# Patient Record
Sex: Female | Born: 2000
Health system: Southern US, Community
[De-identification: ages and names within clinical notes are randomized; demographics above are authoritative.]

## PROBLEM LIST (undated history)

## (undated) DIAGNOSIS — N83209 Unspecified ovarian cyst, unspecified side: Secondary | ICD-10-CM

## (undated) DIAGNOSIS — S63599A Other specified sprain of unspecified wrist, initial encounter: Secondary | ICD-10-CM

## (undated) DIAGNOSIS — Z87898 Personal history of other specified conditions: Secondary | ICD-10-CM

## (undated) HISTORY — PX: CLOSED REDUCTION FOREARM FRACTURE: SHX960

---
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
## 2019-01-19 ENCOUNTER — Encounter: Payer: Self-pay | Admitting: Emergency Medicine

## 2019-01-19 ENCOUNTER — Emergency Department
Admission: EM | Admit: 2019-01-19 | Discharge: 2019-01-19 | Disposition: A | Discharge status: Home / Self Care | Payer: BC Managed Care – PPO | Attending: Emergency Medicine | Admitting: Emergency Medicine

## 2019-01-19 ENCOUNTER — Other Ambulatory Visit: Payer: Self-pay

## 2019-01-19 ENCOUNTER — Emergency Department: Payer: BC Managed Care – PPO

## 2019-01-19 DIAGNOSIS — R519 Headache, unspecified: Secondary | ICD-10-CM | POA: Diagnosis not present

## 2019-01-19 DIAGNOSIS — Z79899 Other long term (current) drug therapy: Secondary | ICD-10-CM | POA: Insufficient documentation

## 2019-01-19 DIAGNOSIS — G43519 Persistent migraine aura without cerebral infarction, intractable, without status migrainosus: Secondary | ICD-10-CM | POA: Diagnosis not present

## 2019-01-19 LAB — POCT PREGNANCY, URINE: Preg Test, Ur: NEGATIVE

## 2019-01-19 MED ORDER — DIPHENHYDRAMINE HCL 50 MG/ML IJ SOLN
12.5000 mg | Freq: Once | INTRAMUSCULAR | Status: AC
  Administered 2019-01-19: 12.5 mg via INTRAVENOUS
  Filled 2019-01-19: qty 1

## 2019-01-19 MED ORDER — SODIUM CHLORIDE 0.9 % IV BOLUS
1000.0000 mL | Freq: Once | INTRAVENOUS | Status: AC
  Administered 2019-01-19: 1000 mL via INTRAVENOUS

## 2019-01-19 MED ORDER — ACETAMINOPHEN 500 MG PO TABS
1000.0000 mg | ORAL_TABLET | Freq: Once | ORAL | Status: AC
  Administered 2019-01-19: 1000 mg via ORAL
  Filled 2019-01-19: qty 2

## 2019-01-19 MED ORDER — METOCLOPRAMIDE HCL 5 MG/ML IJ SOLN
10.0000 mg | Freq: Once | INTRAMUSCULAR | Status: AC
  Administered 2019-01-19: 18:00:00 10 mg via INTRAVENOUS
  Filled 2019-01-19: qty 2

## 2019-01-19 NOTE — ED Provider Notes (Signed)
[facility] Emergency Department Provider Note  ____________________________________________   First MD Initiated Contact with Patient 01/19/19 1644     (approximate)  I have reviewed the triage vital signs and the nursing notes.   HISTORY  Chief Complaint Headache    HPI Paula Aguirre is a 19 y.o. female with history of seizure x1 no cause determined who comes in with pain.  Patient's headache started at 11:30 AM.  Patient states that around 1130 she initially had some vision changes in her right eye that lasted a few minutes and then afterwards she started develop a headache around her eye and into her forehead.  The pain was moderate initially she attempted to fall sleep  though the pain was getting stronger and was then severe and woke her up.  The pain was nonradiating, nothing made it better, she reports sensitivity to light.  She denies a history of migraines but states that her sister had migraines diagnosed in her 78s.  Currently her vision is back to normal.          Past Medical History:  Diagnosis Date  . History of seizure 12/2010   x 1 - no cause determined  . Ovarian cyst   . TFCC (triangular fibrocartilage complex) tear 01/2014   left    Patient Active Problem List   Diagnosis Date Noted  . Well adolescent visit 04/20/2017    Past Surgical History:  Procedure Laterality Date  . CLOSED REDUCTION FOREARM FRACTURE Left   . WRIST ARTHROSCOPY WITH DEBRIDEMENT Left 01/30/2014   Procedure: ARTHROSCOPY LEFT WRIST WITH DEBRIDEMENT;  Surgeon: Cindee Salt, MD;  Location: [facility];  Service: Orthopedics;  Laterality: Left;    Prior to Admission medications   Medication Sig Start Date End Date Taking? Authorizing Provider  cetirizine (ZYRTEC) 10 MG tablet Take by mouth.    [provider]  fluticasone (FLONASE) 50 MCG/ACT nasal spray Place 1 spray into both nostrils daily.    [provider]  ibuprofen  (ADVIL,MOTRIN) 200 MG tablet Take 600 mg by mouth every 6 (six) hours as needed for moderate pain.     [provider]  naproxen (NAPROSYN) 500 MG tablet Take 1 tablet (500 mg total) by mouth 2 (two) times daily with a meal. 10/21/18   Triplett, Cari B, FNP  norethindrone-ethinyl estradiol (JUNEL 1/20) 1-20 MG-MCG tablet Take 1 tablet by mouth daily. 01/12/18   Dianne Dun, MD    Allergies Molds & smuts, Cat hair extract, Gramineae pollens, and Pollen extract  Family History  Problem Relation Age of Onset  . Diabetes Paternal Grandfather   . Heart disease Father        MI    Social History Social History   Tobacco Use  . Smoking status: Never Smoker  . Smokeless tobacco: Never Used  Substance Use Topics  . Alcohol use: No  . Drug use: No      Review of Systems Constitutional: No fever/chills Eyes: Positive vision change ENT: No sore throat. Cardiovascular: Denies chest pain. Respiratory: Denies shortness of breath. Gastrointestinal: No abdominal pain.  No nausea, no vomiting.  No diarrhea.  No constipation. Genitourinary: Negative for dysuria. Musculoskeletal: Negative for back pain. Skin: Negative for rash. Neurological: Positive headache, focal weakness or numbness. All other ROS negative ____________________________________________   PHYSICAL EXAM:  VITAL SIGNS: ED Triage Vitals  Enc Vitals Group     BP 01/19/19 1530 125/81     Pulse Rate 01/19/19  1530 73     Resp 01/19/19 1530 18     Temp 01/19/19 1530 98.3 F (36.8 C)     Temp Source 01/19/19 1530 Oral     SpO2 01/19/19 1530 99 %     Weight 01/19/19 1530 120 lb (54.4 kg)     Height 01/19/19 1530 5\' 4"  (1.626 m)     Head Circumference --      Peak Flow --      Pain Score 01/19/19 1537 8     Pain Loc --      Pain Edu? --      Excl. in [place]? --     Constitutional: Alert and oriented. Well appearing and in no acute distress. Eyes: Conjunctivae are normal. EOMI. Head: Atraumatic. Nose: No  congestion/rhinnorhea. Mouth/Throat: Mucous membranes are moist.   Neck: No stridor. Trachea Midline. FROM Cardiovascular: Normal rate, regular rhythm. Grossly normal heart sounds.  Good peripheral circulation. Respiratory: Normal respiratory effort.  No retractions. Lungs CTAB. Gastrointestinal: Soft and nontender. No distention. No abdominal bruits.  Musculoskeletal: No lower extremity tenderness nor edema.  No joint effusions. Neurologic:  Normal speech and language. No gross focal neurologic deficits are appreciated.  Cranial nerves II through XII appear intact.  Equal strength in her arms and legs Skin:  Skin is warm, dry and intact. No rash noted. Psychiatric: Mood and affect are normal. Speech and behavior are normal. GU: Deferred   ____ RADIOLOGY   Official radiology report(s): CT Head Wo Contrast  Result Date: 01/19/2019 CLINICAL DATA:  Headache with transient blurred vision EXAM: CT HEAD WITHOUT CONTRAST TECHNIQUE: Contiguous axial images were obtained from the base of the skull through the vertex without intravenous contrast. COMPARISON:  December 20, 2010 FINDINGS: Brain: Ventricles and sulci are normal in size and configuration. There is no intracranial mass, hemorrhage, extra-axial fluid collection, or midline shift. Brain parenchyma appears unremarkable. No acute infarct evident. Vascular: No hyperdense vessel.  No evident vascular calcification. Skull: Bony calvarium appears intact. Sinuses/Orbits: There is opacification in a posterior left ethmoid air cell. Other visualized paranasal sinuses are clear. Visualized orbits appear symmetric bilaterally. Other: Mastoid air cells are clear. IMPRESSION: Opacification of a posterior left ethmoid air cell. Study otherwise unremarkable. Electronically Signed   By: Lowella Grip III M.D.   On: 01/19/2019 16:06    ____________________________________________   PROCEDURES  Procedure(s) performed (including Critical  Care):  Procedures   ____________________________________________   INITIAL IMPRESSION / ASSESSMENT AND PLAN / ED COURSE  Paula Aguirre was evaluated in Emergency Department on 01/19/2019 for the symptoms described in the history of present illness. She was evaluated in the context of the global COVID-19 pandemic, which necessitated consideration that the patient might be at risk for infection with the SARS-CoV-2 virus that causes COVID-19. Institutional protocols and algorithms that pertain to the evaluation of patients at risk for COVID-19 are in a state of rapid change based on information released by regulatory bodies including the CDC and federal and state organizations. These policies and algorithms were followed during the patient's care in the ED.    Patient is a very well-appearing 19 year old who presents with some vision changes then developing into a progressively worsening headache.  Lower suspicion for subarachnoid hemorrhage but CT scan was ordered in triage.  Given the CT scan was done within 6 hours of onset of symptoms I have very low suspicion that this is a subarachnoid hemorrhage.  We discussed lumbar puncture but opted to hold off at  this time given low suspicion. Also have low suspicion for meningitis given supple neck and afebrile.  I also have low suspicion for pseudotumor cerebri given she does not continue to have blurry vision and is not overweight.  Extraocular movements are intact I have low suspicion for cavernous thrombosis as well at this time given aura that then developed into headache. Discussed with patient getting medications and seeing how she does.  CT scan does show some opacification of the posterior left ethmoid air cell which she states that she does have allergies recently and she is taking medications for this.  Her pain however is on the right side of her head therefore I will suspicion that this is contributing to this. low suspicion for acute  sinusitis at this time given patient is afebrile elected to hold off on antibiotics.  Patient feeling much better after treatment.  Patient feels comfortable being discharged at this time.  She will follow with her primary care doctor as needed.  We discussed return precautions as well   I discussed the provisional nature of ED diagnosis, the treatment so far, the ongoing plan of care, follow up appointments and return precautions with the patient and any family or support people present. They expressed understanding and agreed with the plan, discharged home.  ____________________________________________   FINAL CLINICAL IMPRESSION(S) / ED DIAGNOSES   Final diagnoses:  Intractable persistent migraine aura without cerebral infarction and without status migrainosus      MEDICATIONS GIVEN DURING THIS VISIT:  Medications  sodium chloride 0.9 % bolus 1,000 mL (1,000 mLs Intravenous New Bag/Given 01/19/19 1745)  acetaminophen (TYLENOL) tablet 1,000 mg (1,000 mg Oral Given 01/19/19 1742)  metoCLOPramide (REGLAN) injection 10 mg (10 mg Intravenous Given 01/19/19 1742)  diphenhydrAMINE (BENADRYL) injection 12.5 mg (12.5 mg Intravenous Given 01/19/19 1743)     ED Discharge Orders    None       Note:  This document was prepared using Dragon voice recognition software and may include unintentional dictation errors.   Concha Se, MD 01/19/19 1810

## 2019-01-19 NOTE — ED Triage Notes (Signed)
Patient presents to the ED with a "stabbing pain" to the right side of her head.  Patient denies history of severe headaches or migraines.  Patient states when headache began she had some blurry vision but that has now resolved.  Patient reports feeling nauseous, denies vomiting.

## 2019-01-19 NOTE — ED Notes (Signed)
Patient states headache began at 11:30am.  This RN spoke to Dr. Erma Heritage regarding patient and orders.

## 2019-01-19 NOTE — Discharge Instructions (Addendum)
Your CT scan is as below.  I think most likely this was a migraine.  You can take Tylenol 1 g every 8 hours and ibuprofen 400 every 6 hours to help with your pain.  You follow-up with your primary doctor.  If these continue to happen you may need to be started on preventative medicines.  Return to the ER for fevers, worsening headache, blacking out or any other concerns  IMPRESSION:  Opacification of a posterior left ethmoid air cell. Study otherwise  unremarkable.

## 2019-01-23 ENCOUNTER — Other Ambulatory Visit: Payer: Self-pay | Admitting: Family Medicine

## 2019-01-23 DIAGNOSIS — N83201 Unspecified ovarian cyst, right side: Secondary | ICD-10-CM

## 2019-01-24 DIAGNOSIS — J0140 Acute pansinusitis, unspecified: Secondary | ICD-10-CM | POA: Diagnosis not present

## 2019-01-26 DIAGNOSIS — Z03818 Encounter for observation for suspected exposure to other biological agents ruled out: Secondary | ICD-10-CM | POA: Diagnosis not present

## 2019-01-26 DIAGNOSIS — Z20828 Contact with and (suspected) exposure to other viral communicable diseases: Secondary | ICD-10-CM | POA: Diagnosis not present

## 2019-02-09 ENCOUNTER — Ambulatory Visit: Payer: BC Managed Care – PPO | Attending: Internal Medicine

## 2019-02-09 DIAGNOSIS — Z20822 Contact with and (suspected) exposure to covid-19: Secondary | ICD-10-CM | POA: Insufficient documentation

## 2019-02-10 DIAGNOSIS — B373 Candidiasis of vulva and vagina: Secondary | ICD-10-CM | POA: Diagnosis not present

## 2019-02-10 DIAGNOSIS — R000 Tachycardia, unspecified: Secondary | ICD-10-CM | POA: Diagnosis not present

## 2019-02-10 LAB — NOVEL CORONAVIRUS, NAA: SARS-CoV-2, NAA: NOT DETECTED

## 2019-02-24 ENCOUNTER — Telehealth: Payer: Self-pay | Admitting: General Practice

## 2019-02-24 NOTE — Telephone Encounter (Signed)
Patient is calling to see if we received a fax from [facility] to complete and fax back. Explained to patient that she needed to choose a new PCP due to Dr. Dayton Paula Aguirre leaving the practice. Pls advise. CB is [phone]

## 2019-02-27 ENCOUNTER — Telehealth: Payer: Self-pay | Admitting: General Practice

## 2019-02-27 NOTE — Telephone Encounter (Signed)
Patient mother is calling and requesting a TOC from Dr. Dayton Martes to Dr. Barron Alvine. Pls advise. CB is [phone]

## 2019-02-27 NOTE — Telephone Encounter (Signed)
Patient mother is calling back to check the status of medication refill. CB is [phone]

## 2019-02-28 NOTE — Telephone Encounter (Signed)
I sent fax to pharmacy advising that pt needs to schedule OV for refills/she has been told this/thx dmf

## 2019-02-28 NOTE — Telephone Encounter (Signed)
MC-Pt is requesting to schedule a TOC visit with you/she is also needing her medication until she can get in to see you/plz advise on both issues/thx dmf

## 2019-03-01 ENCOUNTER — Other Ambulatory Visit: Payer: Self-pay

## 2019-03-01 DIAGNOSIS — N83201 Unspecified ovarian cyst, right side: Secondary | ICD-10-CM

## 2019-03-01 MED ORDER — NORETHINDRONE ACET-ETHINYL EST 1-20 MG-MCG PO TABS: ORAL_TABLET | ORAL | 0 refills | Status: DC

## 2019-03-01 NOTE — Telephone Encounter (Signed)
Barkley Bruns is calling and wanted to speak to someone regarding patients medication that was sent over. Pls advise  CB is [phone]

## 2019-03-01 NOTE — Telephone Encounter (Signed)
loestrin was sent to pharm today Please call pt/pts mother to find out what the issue/concern is

## 2019-03-02 NOTE — Telephone Encounter (Signed)
I spoke with Cordelia Pen from pharmacy and she wanted to know if rx is for 21 day as before or 28 day.  I informed her to fill as last request, which was 21 days.

## 2019-03-09 ENCOUNTER — Encounter: Payer: Self-pay | Admitting: Family Medicine

## 2019-03-09 ENCOUNTER — Telehealth (INDEPENDENT_AMBULATORY_CARE_PROVIDER_SITE_OTHER): Payer: BC Managed Care – PPO | Admitting: Family Medicine

## 2019-03-09 VITALS — Ht 64.0 in | Wt 125.0 lb

## 2019-03-09 DIAGNOSIS — N83201 Unspecified ovarian cyst, right side: Secondary | ICD-10-CM | POA: Diagnosis not present

## 2019-03-09 DIAGNOSIS — Z3041 Encounter for surveillance of contraceptive pills: Secondary | ICD-10-CM | POA: Diagnosis not present

## 2019-03-09 MED ORDER — NORETHINDRONE ACET-ETHINYL EST 1-20 MG-MCG PO TABS: ORAL_TABLET | ORAL | 3 refills | Status: AC

## 2019-03-09 NOTE — Progress Notes (Signed)
Virtual Visit via Video Note  I connected with Paula Aguirre on 03/09/19 at 11:30 AM EST by a video enabled telemedicine application and verified that I am speaking with the correct person using two identifiers. Location patient: home Location provider: home office Persons participating in the virtual visit: patient, provider  I discussed the limitations of evaluation and management by telemedicine and the availability of in person appointments. The patient expressed understanding and agreed to proceed.  Chief Complaint  Patient presents with  . Establish Care    Medication refill/BC     HPI: Paula Aguirre is a 19 y.o. female who is a former patient of Dr. Deborra Medina. She presents today requesting a refill of her OCPs. FDLMP 02/20/19. Periods are regular. No spotting or abnormal bleeding.  Pt denies any side effects. She does not have an acute issues today. She is overdue for CPE and I recommended she schedule this in the next 2 mo or so.       Past Medical History:  Diagnosis Date  . History of seizure 12/2010   x 1 - no cause determined  . Ovarian cyst   . TFCC (triangular fibrocartilage complex) tear 01/2014   left    Past Surgical History:  Procedure Laterality Date  . CLOSED REDUCTION FOREARM FRACTURE Left   . WRIST ARTHROSCOPY WITH DEBRIDEMENT Left 01/30/2014   Procedure: ARTHROSCOPY LEFT WRIST WITH DEBRIDEMENT;  Surgeon: Daryll Brod, MD;  Location: [facility];  Service: Orthopedics;  Laterality: Left;    Family History  Problem Relation Age of Onset  . Diabetes Paternal Grandfather   . Heart disease Father        MI    Social History   Tobacco Use  . Smoking status: Never Smoker  . Smokeless tobacco: Never Used  Substance Use Topics  . Alcohol use: No  . Drug use: No     Current Outpatient Medications:  .  albuterol (VENTOLIN HFA) 108 (90 Base) MCG/ACT inhaler, Inhale into the lungs., Disp: , Rfl:  .  fluticasone (FLONASE) 50 MCG/ACT  nasal spray, Place 1 spray into both nostrils daily., Disp: , Rfl:  .  ibuprofen (ADVIL,MOTRIN) 200 MG tablet, Take 600 mg by mouth every 6 (six) hours as needed for moderate pain. , Disp: , Rfl:  .  norethindrone-ethinyl estradiol (LOESTRIN) 1-20 MG-MCG tablet, Take 1qd, Disp: 1 Package, Rfl: 0 .  cetirizine (ZYRTEC) 10 MG tablet, Take by mouth., Disp: , Rfl:  .  naproxen (NAPROSYN) 500 MG tablet, Take 1 tablet (500 mg total) by mouth 2 (two) times daily with a meal., Disp: 30 tablet, Rfl: 0  Allergies  Allergen Reactions  . Molds & Smuts   . Cat Hair Extract Itching    Allergic rhinitis, sneezing, congestion  . Gramineae Pollens Itching and Rash    Allergic rhinitis  . Pollen Extract Itching    Allergic rhinitis, sneezing,        ROS: See pertinent positives and negatives per HPI.   EXAM:  VITALS per patient if applicable: Ht 5\' 4"  (1.626 m)   Wt 125 lb (56.7 kg)   LMP 02/20/2019   BMI 21.46 kg/m    GENERAL: alert, oriented, appears well and in no acute distress  HEENT: atraumatic, conjunctiva clear, no obvious abnormalities on inspection of external nose and ears  NECK: normal movements of the head and neck  LUNGS: on inspection no signs of respiratory distress, breathing rate appears normal, no obvious gross SOB, gasping or  wheezing, no conversational dyspnea  CV: no obvious cyanosis  PSYCH/NEURO: pleasant and cooperative, speech and thought processing grossly intact   ASSESSMENT AND PLAN:  1. Cyst of right ovary 2. Encounter for birth control pills maintenance - stable, no issues or side effects Refill: - norethindrone-ethinyl estradiol (LOESTRIN) 1-20 MG-MCG tablet; Take 1qd  Dispense: 3 Package; Refill: 3 - advised pt to schedule CPE appt in the next 64mo or so since she has not been seen in almost 2 years. Pt expressed understanding and agreement.   I discussed the assessment and treatment plan with the patient. The patient was provided an opportunity to  ask questions and all were answered. The patient agreed with the plan and demonstrated an understanding of the instructions.   The patient was advised to call back or seek an in-person evaluation if the symptoms worsen or if the condition fails to improve as anticipated.   Luana Shu, DO

## 2019-03-09 NOTE — Patient Instructions (Signed)
Health Maintenance Due  Topic Date Due  . HIV Screening  08/11/2014  . INFLUENZA VACCINE  08/06/2018    No flowsheet data found.

## 2019-05-05 ENCOUNTER — Ambulatory Visit: Payer: BC Managed Care – PPO | Admitting: Family Medicine

## 2019-05-08 DIAGNOSIS — Z01419 Encounter for gynecological examination (general) (routine) without abnormal findings: Secondary | ICD-10-CM | POA: Diagnosis not present

## 2019-05-08 DIAGNOSIS — Z3040 Encounter for surveillance of contraceptives, unspecified: Secondary | ICD-10-CM | POA: Diagnosis not present

## 2019-05-08 DIAGNOSIS — N898 Other specified noninflammatory disorders of vagina: Secondary | ICD-10-CM | POA: Diagnosis not present

## 2019-05-08 DIAGNOSIS — A599 Trichomoniasis, unspecified: Secondary | ICD-10-CM | POA: Diagnosis not present

## 2019-05-08 DIAGNOSIS — Z113 Encounter for screening for infections with a predominantly sexual mode of transmission: Secondary | ICD-10-CM | POA: Diagnosis not present

## 2019-05-08 DIAGNOSIS — N921 Excessive and frequent menstruation with irregular cycle: Secondary | ICD-10-CM | POA: Diagnosis not present

## 2019-05-08 DIAGNOSIS — Z6821 Body mass index (BMI) 21.0-21.9, adult: Secondary | ICD-10-CM | POA: Diagnosis not present

## 2019-05-09 ENCOUNTER — Other Ambulatory Visit: Payer: Self-pay

## 2019-05-09 DIAGNOSIS — M6249 Contracture of muscle, multiple sites: Secondary | ICD-10-CM | POA: Diagnosis not present

## 2019-05-09 DIAGNOSIS — S39011A Strain of muscle, fascia and tendon of abdomen, initial encounter: Secondary | ICD-10-CM | POA: Diagnosis not present

## 2019-05-09 DIAGNOSIS — M9902 Segmental and somatic dysfunction of thoracic region: Secondary | ICD-10-CM | POA: Diagnosis not present

## 2019-05-10 ENCOUNTER — Encounter: Payer: Self-pay | Admitting: Family Medicine

## 2019-05-10 ENCOUNTER — Ambulatory Visit (INDEPENDENT_AMBULATORY_CARE_PROVIDER_SITE_OTHER): Payer: BC Managed Care – PPO | Admitting: Family Medicine

## 2019-05-10 VITALS — BP 106/72 | HR 97 | Temp 98.6°F | Ht 64.0 in | Wt 122.6 lb

## 2019-05-10 DIAGNOSIS — Z0000 Encounter for general adult medical examination without abnormal findings: Secondary | ICD-10-CM

## 2019-05-10 DIAGNOSIS — Z130 Encounter for screening for diseases of the blood and blood-forming organs and certain disorders involving the immune mechanism: Secondary | ICD-10-CM

## 2019-05-10 NOTE — Progress Notes (Signed)
Paula Aguirre is a 19 y.o. female  Chief Complaint  Patient presents with  . Annual Exam    Pt would like to discuss getting a sickle cell test.  Pt not fasting for labs today.    HPI: Paula Aguirre is a 19 y.o. female here for annual CPE. She has form for cheerleading. Tryouts require sickle cell test.  She saw OB-GYN on 5/3 for routine exam - no issues.  Diet/Exercise: average diet, regular exercise Dental: UTD Vision: pt wears Rx readings glasses, UTD on eye exam  Med refills needed today? none    Past Medical History:  Diagnosis Date  . History of seizure 12/2010   x 1 - no cause determined  . Ovarian cyst   . TFCC (triangular fibrocartilage complex) tear 01/2014   left    Past Surgical History:  Procedure Laterality Date  . CLOSED REDUCTION FOREARM FRACTURE Left   . WRIST ARTHROSCOPY WITH DEBRIDEMENT Left 01/30/2014   Procedure: ARTHROSCOPY LEFT WRIST WITH DEBRIDEMENT;  Surgeon: Daryll Brod, MD;  Location: [facility];  Service: Orthopedics;  Laterality: Left;    Social History   Socioeconomic History  . Marital status: Single    Spouse name: Not on file  . Number of children: Not on file  . Years of education: Not on file  . Highest education level: Not on file  Occupational History  . Not on file  Tobacco Use  . Smoking status: Never Smoker  . Smokeless tobacco: Never Used  Substance and Sexual Activity  . Alcohol use: No  . Drug use: No  . Sexual activity: Never    Birth control/protection: None  Other Topics Concern  . Not on file  Social History Narrative  . Not on file   Social Determinants of Health   Financial Resource Strain:   . Difficulty of Paying Living Expenses:   Food Insecurity:   . Worried About Charity fundraiser in the Last Year:   . Arboriculturist in the Last Year:   Transportation Needs:   . Film/video editor (Medical):   Marland Kitchen Lack of Transportation (Non-Medical):   Physical Activity:   . Days  of Exercise per Week:   . Minutes of Exercise per Session:   Stress:   . Feeling of Stress :   Social Connections:   . Frequency of Communication with Friends and Family:   . Frequency of Social Gatherings with Friends and Family:   . Attends Religious Services:   . Active Member of Clubs or Organizations:   . Attends Archivist Meetings:   Marland Kitchen Marital Status:   Intimate Partner Violence:   . Fear of Current or Ex-Partner:   . Emotionally Abused:   Marland Kitchen Physically Abused:   . Sexually Abused:     Family History  Problem Relation Age of Onset  . Diabetes Paternal Grandfather   . Heart disease Father        MI     Immunization History  Administered Date(s) Administered  . DTaP 10/16/1999, 01/05/2000, 06/21/2000, 03/07/2001, 07/14/2004  . Hepatitis A 08/29/2007, 10/04/2008  . Hepatitis B 03-01-1999, 09/12/1999, 03/07/2001  . HiB (PRP-OMP) 10/16/1999, 01/05/2000, 06/21/2000, 11/23/2000  . IPV 11/24/1999, 06/21/2000, 03/07/2001, 07/14/2004  . Influenza Nasal 10/04/2008  . Influenza,inj,Quad PF,6+ Mos 08/20/2016  . MMR 11/23/2000, 07/14/2004  . Meningococcal Mcv4o 08/07/2010, 08/19/2015  . Pneumococcal Conjugate-13 10/16/1999, 01/05/2000, 06/21/2000, 11/23/2000  . Tdap 08/07/2010  . Varicella 11/23/2000, 04/20/2017  Outpatient Encounter Medications as of 05/10/2019  Medication Sig  . albuterol (VENTOLIN HFA) 108 (90 Base) MCG/ACT inhaler Inhale into the lungs.  . fluticasone (FLONASE) 50 MCG/ACT nasal spray Place 1 spray into both nostrils daily.  Marland Kitchen ibuprofen (ADVIL,MOTRIN) 200 MG tablet Take 600 mg by mouth every 6 (six) hours as needed for moderate pain.   Marland Kitchen norethindrone-ethinyl estradiol (LOESTRIN) 1-20 MG-MCG tablet Take 1qd  . [DISCONTINUED] cetirizine (ZYRTEC) 10 MG tablet Take by mouth.  . [DISCONTINUED] naproxen (NAPROSYN) 500 MG tablet Take 1 tablet (500 mg total) by mouth 2 (two) times daily with a meal.   No facility-administered encounter medications on  file as of 05/10/2019.     ROS: Gen: no fever, chills  Skin: no rash, itching ENT: no ear pain, ear drainage, nasal congestion, rhinorrhea, sinus pressure, sore throat Eyes: no blurry vision, double vision Resp: no cough, wheeze,SOB Breast: no breast tenderness, no nipple discharge, no breast masses CV: no CP, palpitations, LE edema,  GI: no heartburn, n/v/d/c, abd pain GU: no dysuria, urgency, frequency, hematuria; no vaginal itching, odor, discharge MSK: no joint pain, myalgias, back pain Neuro: no dizziness, headache, weakness, vertigo Psych: no depression, anxiety, insomnia   Allergies  Allergen Reactions  . Molds & Smuts   . Cat Hair Extract Itching    Allergic rhinitis, sneezing, congestion  . Gramineae Pollens Itching and Rash    Allergic rhinitis  . Pollen Extract Itching    Allergic rhinitis, sneezing,      BP 106/72 (BP Location: Right Arm)   Pulse 97   Temp 98.6 F (37 C) (Temporal)   Ht '5\' 4"'  (1.626 m)   Wt 122 lb 9.6 oz (55.6 kg)   LMP 03/24/2019   SpO2 98%   BMI 21.04 kg/m    BP Readings from Last 3 Encounters:  05/10/19 106/72  01/19/19 102/60  10/21/18 110/70   Pulse Readings from Last 3 Encounters:  05/10/19 97  01/19/19 70  10/21/18 92      Physical Exam  Constitutional: She is oriented to person, place, and time. She appears well-developed and well-nourished. No distress.  HENT:  Head: Normocephalic and atraumatic.  Right Ear: Tympanic membrane and ear canal normal.  Left Ear: Tympanic membrane and ear canal normal.  Nose: Nose normal.  Mouth/Throat: Oropharynx is clear and moist and mucous membranes are normal.  Eyes: Pupils are equal, round, and reactive to light. Conjunctivae are normal.  Neck: No thyromegaly present.  Cardiovascular: Normal rate, regular rhythm, normal heart sounds and intact distal pulses.  No murmur heard. Pulmonary/Chest: Effort normal and breath sounds normal. No respiratory distress. She has no wheezes. She  has no rhonchi.  Abdominal: Soft. Bowel sounds are normal. She exhibits no distension and no mass. There is no abdominal tenderness.  Musculoskeletal:        General: No edema.     Cervical back: Neck supple.  Lymphadenopathy:    She has no cervical adenopathy.  Neurological: She is alert and oriented to person, place, and time. She exhibits normal muscle tone. Coordination normal.  Skin: Skin is warm and dry.  Psychiatric: She has a normal mood and affect. Her behavior is normal.     A/P:  1. Annual physical exam - discussed importance of regular CV exercise, healthy diet, adequate sleep - UTD on immunizations - UTD on vision and dental exams - sports form completed for pt  2. Screening for sickle-cell disease or trait - Sickle Cell Scr   This  visit occurred during the SARS-CoV-2 public health emergency.  Safety protocols were in place, including screening questions prior to the visit, additional usage of staff PPE, and extensive cleaning of exam room while observing appropriate contact time as indicated for disinfecting solutions.

## 2019-05-11 DIAGNOSIS — M9902 Segmental and somatic dysfunction of thoracic region: Secondary | ICD-10-CM | POA: Diagnosis not present

## 2019-05-11 DIAGNOSIS — M6249 Contracture of muscle, multiple sites: Secondary | ICD-10-CM | POA: Diagnosis not present

## 2019-05-11 DIAGNOSIS — S39011A Strain of muscle, fascia and tendon of abdomen, initial encounter: Secondary | ICD-10-CM | POA: Diagnosis not present

## 2019-05-11 LAB — SICKLE CELL SCREEN: Sickle Solubility Test - HGBRFX: NEGATIVE

## 2019-05-16 DIAGNOSIS — M9902 Segmental and somatic dysfunction of thoracic region: Secondary | ICD-10-CM | POA: Diagnosis not present

## 2019-05-16 DIAGNOSIS — M6249 Contracture of muscle, multiple sites: Secondary | ICD-10-CM | POA: Diagnosis not present

## 2019-05-16 DIAGNOSIS — S39011A Strain of muscle, fascia and tendon of abdomen, initial encounter: Secondary | ICD-10-CM | POA: Diagnosis not present

## 2019-05-18 DIAGNOSIS — M6249 Contracture of muscle, multiple sites: Secondary | ICD-10-CM | POA: Diagnosis not present

## 2019-05-18 DIAGNOSIS — S39011A Strain of muscle, fascia and tendon of abdomen, initial encounter: Secondary | ICD-10-CM | POA: Diagnosis not present

## 2019-05-18 DIAGNOSIS — M9902 Segmental and somatic dysfunction of thoracic region: Secondary | ICD-10-CM | POA: Diagnosis not present

## 2019-07-03 DIAGNOSIS — J018 Other acute sinusitis: Secondary | ICD-10-CM | POA: Diagnosis not present

## 2019-07-04 DIAGNOSIS — J0140 Acute pansinusitis, unspecified: Secondary | ICD-10-CM | POA: Diagnosis not present

## 2019-09-17 DIAGNOSIS — J029 Acute pharyngitis, unspecified: Secondary | ICD-10-CM | POA: Diagnosis not present

## 2019-09-17 DIAGNOSIS — R0989 Other specified symptoms and signs involving the circulatory and respiratory systems: Secondary | ICD-10-CM | POA: Diagnosis not present

## 2019-09-18 DIAGNOSIS — J0140 Acute pansinusitis, unspecified: Secondary | ICD-10-CM | POA: Diagnosis not present

## 2019-09-20 DIAGNOSIS — Z20822 Contact with and (suspected) exposure to covid-19: Secondary | ICD-10-CM | POA: Diagnosis not present

## 2019-09-23 DIAGNOSIS — Z20822 Contact with and (suspected) exposure to covid-19: Secondary | ICD-10-CM | POA: Diagnosis not present

## 2019-10-10 DIAGNOSIS — J329 Chronic sinusitis, unspecified: Secondary | ICD-10-CM | POA: Diagnosis not present

## 2019-10-10 DIAGNOSIS — J45991 Cough variant asthma: Secondary | ICD-10-CM | POA: Diagnosis not present

## 2019-10-10 DIAGNOSIS — J301 Allergic rhinitis due to pollen: Secondary | ICD-10-CM | POA: Diagnosis not present

## 2019-10-10 DIAGNOSIS — J309 Allergic rhinitis, unspecified: Secondary | ICD-10-CM | POA: Diagnosis not present

## 2019-11-02 DIAGNOSIS — S40011A Contusion of right shoulder, initial encounter: Secondary | ICD-10-CM | POA: Diagnosis not present

## 2019-11-02 DIAGNOSIS — J301 Allergic rhinitis due to pollen: Secondary | ICD-10-CM | POA: Diagnosis not present

## 2019-11-10 DIAGNOSIS — S40011D Contusion of right shoulder, subsequent encounter: Secondary | ICD-10-CM | POA: Diagnosis not present

## 2019-11-10 DIAGNOSIS — M25511 Pain in right shoulder: Secondary | ICD-10-CM | POA: Diagnosis not present

## 2019-11-14 DIAGNOSIS — M25511 Pain in right shoulder: Secondary | ICD-10-CM | POA: Diagnosis not present

## 2019-11-14 DIAGNOSIS — S40011D Contusion of right shoulder, subsequent encounter: Secondary | ICD-10-CM | POA: Diagnosis not present

## 2019-11-28 DIAGNOSIS — M25511 Pain in right shoulder: Secondary | ICD-10-CM | POA: Diagnosis not present

## 2019-11-28 DIAGNOSIS — M25611 Stiffness of right shoulder, not elsewhere classified: Secondary | ICD-10-CM | POA: Diagnosis not present

## 2019-11-28 DIAGNOSIS — M6281 Muscle weakness (generalized): Secondary | ICD-10-CM | POA: Diagnosis not present

## 2019-11-28 DIAGNOSIS — S43431D Superior glenoid labrum lesion of right shoulder, subsequent encounter: Secondary | ICD-10-CM | POA: Diagnosis not present

## 2019-12-05 DIAGNOSIS — M25511 Pain in right shoulder: Secondary | ICD-10-CM | POA: Diagnosis not present

## 2019-12-05 DIAGNOSIS — M25611 Stiffness of right shoulder, not elsewhere classified: Secondary | ICD-10-CM | POA: Diagnosis not present

## 2019-12-05 DIAGNOSIS — S43431D Superior glenoid labrum lesion of right shoulder, subsequent encounter: Secondary | ICD-10-CM | POA: Diagnosis not present

## 2019-12-05 DIAGNOSIS — M6281 Muscle weakness (generalized): Secondary | ICD-10-CM | POA: Diagnosis not present

## 2019-12-13 DIAGNOSIS — G8918 Other acute postprocedural pain: Secondary | ICD-10-CM | POA: Diagnosis not present

## 2019-12-13 DIAGNOSIS — S43004A Unspecified dislocation of right shoulder joint, initial encounter: Secondary | ICD-10-CM | POA: Diagnosis not present

## 2019-12-13 DIAGNOSIS — Y999 Unspecified external cause status: Secondary | ICD-10-CM | POA: Diagnosis not present

## 2019-12-13 DIAGNOSIS — X58XXXA Exposure to other specified factors, initial encounter: Secondary | ICD-10-CM | POA: Diagnosis not present

## 2019-12-13 DIAGNOSIS — S43491A Other sprain of right shoulder joint, initial encounter: Secondary | ICD-10-CM | POA: Diagnosis not present

## 2019-12-13 DIAGNOSIS — M25311 Other instability, right shoulder: Secondary | ICD-10-CM | POA: Diagnosis not present

## 2019-12-22 DIAGNOSIS — S43004D Unspecified dislocation of right shoulder joint, subsequent encounter: Secondary | ICD-10-CM | POA: Diagnosis not present

## 2020-01-09 DIAGNOSIS — N390 Urinary tract infection, site not specified: Secondary | ICD-10-CM | POA: Diagnosis not present

## 2020-01-09 DIAGNOSIS — M6281 Muscle weakness (generalized): Secondary | ICD-10-CM | POA: Diagnosis not present

## 2020-01-09 DIAGNOSIS — M25611 Stiffness of right shoulder, not elsewhere classified: Secondary | ICD-10-CM | POA: Diagnosis not present

## 2020-01-09 DIAGNOSIS — M25511 Pain in right shoulder: Secondary | ICD-10-CM | POA: Diagnosis not present

## 2020-01-18 DIAGNOSIS — M25611 Stiffness of right shoulder, not elsewhere classified: Secondary | ICD-10-CM | POA: Diagnosis not present

## 2020-01-18 DIAGNOSIS — M25511 Pain in right shoulder: Secondary | ICD-10-CM | POA: Diagnosis not present

## 2020-01-18 DIAGNOSIS — M6281 Muscle weakness (generalized): Secondary | ICD-10-CM | POA: Diagnosis not present

## 2020-01-23 DIAGNOSIS — M6281 Muscle weakness (generalized): Secondary | ICD-10-CM | POA: Diagnosis not present

## 2020-01-23 DIAGNOSIS — M25511 Pain in right shoulder: Secondary | ICD-10-CM | POA: Diagnosis not present

## 2020-01-23 DIAGNOSIS — M25611 Stiffness of right shoulder, not elsewhere classified: Secondary | ICD-10-CM | POA: Diagnosis not present

## 2020-01-25 DIAGNOSIS — M6281 Muscle weakness (generalized): Secondary | ICD-10-CM | POA: Diagnosis not present

## 2020-01-25 DIAGNOSIS — M25511 Pain in right shoulder: Secondary | ICD-10-CM | POA: Diagnosis not present

## 2020-01-25 DIAGNOSIS — M25611 Stiffness of right shoulder, not elsewhere classified: Secondary | ICD-10-CM | POA: Diagnosis not present

## 2020-01-26 DIAGNOSIS — M25511 Pain in right shoulder: Secondary | ICD-10-CM | POA: Diagnosis not present

## 2020-02-01 DIAGNOSIS — M6281 Muscle weakness (generalized): Secondary | ICD-10-CM | POA: Diagnosis not present

## 2020-02-01 DIAGNOSIS — M25611 Stiffness of right shoulder, not elsewhere classified: Secondary | ICD-10-CM | POA: Diagnosis not present

## 2020-02-01 DIAGNOSIS — M25511 Pain in right shoulder: Secondary | ICD-10-CM | POA: Diagnosis not present

## 2020-02-06 DIAGNOSIS — R011 Cardiac murmur, unspecified: Secondary | ICD-10-CM | POA: Diagnosis not present

## 2020-02-06 DIAGNOSIS — M25511 Pain in right shoulder: Secondary | ICD-10-CM | POA: Diagnosis not present

## 2020-02-06 DIAGNOSIS — M25611 Stiffness of right shoulder, not elsewhere classified: Secondary | ICD-10-CM | POA: Diagnosis not present

## 2020-02-06 DIAGNOSIS — M6281 Muscle weakness (generalized): Secondary | ICD-10-CM | POA: Diagnosis not present

## 2020-02-06 DIAGNOSIS — Z23 Encounter for immunization: Secondary | ICD-10-CM | POA: Diagnosis not present

## 2020-02-08 DIAGNOSIS — M25611 Stiffness of right shoulder, not elsewhere classified: Secondary | ICD-10-CM | POA: Diagnosis not present

## 2020-02-08 DIAGNOSIS — M25511 Pain in right shoulder: Secondary | ICD-10-CM | POA: Diagnosis not present

## 2020-02-08 DIAGNOSIS — M6281 Muscle weakness (generalized): Secondary | ICD-10-CM | POA: Diagnosis not present

## 2020-02-15 DIAGNOSIS — M6281 Muscle weakness (generalized): Secondary | ICD-10-CM | POA: Diagnosis not present

## 2020-02-15 DIAGNOSIS — M25611 Stiffness of right shoulder, not elsewhere classified: Secondary | ICD-10-CM | POA: Diagnosis not present

## 2020-02-15 DIAGNOSIS — M25511 Pain in right shoulder: Secondary | ICD-10-CM | POA: Diagnosis not present

## 2020-02-20 DIAGNOSIS — M25511 Pain in right shoulder: Secondary | ICD-10-CM | POA: Diagnosis not present

## 2020-02-20 DIAGNOSIS — M6281 Muscle weakness (generalized): Secondary | ICD-10-CM | POA: Diagnosis not present

## 2020-02-20 DIAGNOSIS — M25611 Stiffness of right shoulder, not elsewhere classified: Secondary | ICD-10-CM | POA: Diagnosis not present

## 2020-02-22 DIAGNOSIS — D519 Vitamin B12 deficiency anemia, unspecified: Secondary | ICD-10-CM | POA: Diagnosis not present

## 2020-02-22 DIAGNOSIS — E559 Vitamin D deficiency, unspecified: Secondary | ICD-10-CM | POA: Diagnosis not present

## 2020-02-22 DIAGNOSIS — E7800 Pure hypercholesterolemia, unspecified: Secondary | ICD-10-CM | POA: Diagnosis not present

## 2020-02-22 DIAGNOSIS — R7303 Prediabetes: Secondary | ICD-10-CM | POA: Diagnosis not present

## 2020-02-22 DIAGNOSIS — M25511 Pain in right shoulder: Secondary | ICD-10-CM | POA: Diagnosis not present

## 2020-02-22 DIAGNOSIS — M6281 Muscle weakness (generalized): Secondary | ICD-10-CM | POA: Diagnosis not present

## 2020-02-22 DIAGNOSIS — M25611 Stiffness of right shoulder, not elsewhere classified: Secondary | ICD-10-CM | POA: Diagnosis not present

## 2020-02-27 DIAGNOSIS — M25611 Stiffness of right shoulder, not elsewhere classified: Secondary | ICD-10-CM | POA: Diagnosis not present

## 2020-02-27 DIAGNOSIS — M25511 Pain in right shoulder: Secondary | ICD-10-CM | POA: Diagnosis not present

## 2020-02-27 DIAGNOSIS — M6281 Muscle weakness (generalized): Secondary | ICD-10-CM | POA: Diagnosis not present

## 2020-02-29 DIAGNOSIS — M25511 Pain in right shoulder: Secondary | ICD-10-CM | POA: Diagnosis not present

## 2020-02-29 DIAGNOSIS — M25611 Stiffness of right shoulder, not elsewhere classified: Secondary | ICD-10-CM | POA: Diagnosis not present

## 2020-02-29 DIAGNOSIS — M6281 Muscle weakness (generalized): Secondary | ICD-10-CM | POA: Diagnosis not present

## 2020-03-05 DIAGNOSIS — M25611 Stiffness of right shoulder, not elsewhere classified: Secondary | ICD-10-CM | POA: Diagnosis not present

## 2020-03-05 DIAGNOSIS — M6281 Muscle weakness (generalized): Secondary | ICD-10-CM | POA: Diagnosis not present

## 2020-03-05 DIAGNOSIS — M25511 Pain in right shoulder: Secondary | ICD-10-CM | POA: Diagnosis not present

## 2020-03-07 DIAGNOSIS — M25611 Stiffness of right shoulder, not elsewhere classified: Secondary | ICD-10-CM | POA: Diagnosis not present

## 2020-03-07 DIAGNOSIS — M25511 Pain in right shoulder: Secondary | ICD-10-CM | POA: Diagnosis not present

## 2020-03-07 DIAGNOSIS — M6281 Muscle weakness (generalized): Secondary | ICD-10-CM | POA: Diagnosis not present

## 2020-03-11 DIAGNOSIS — M25611 Stiffness of right shoulder, not elsewhere classified: Secondary | ICD-10-CM | POA: Diagnosis not present

## 2020-03-11 DIAGNOSIS — M25511 Pain in right shoulder: Secondary | ICD-10-CM | POA: Diagnosis not present

## 2020-03-11 DIAGNOSIS — M6281 Muscle weakness (generalized): Secondary | ICD-10-CM | POA: Diagnosis not present

## 2020-03-26 DIAGNOSIS — M6281 Muscle weakness (generalized): Secondary | ICD-10-CM | POA: Diagnosis not present

## 2020-03-26 DIAGNOSIS — M25611 Stiffness of right shoulder, not elsewhere classified: Secondary | ICD-10-CM | POA: Diagnosis not present

## 2020-03-26 DIAGNOSIS — M25511 Pain in right shoulder: Secondary | ICD-10-CM | POA: Diagnosis not present

## 2020-04-01 DIAGNOSIS — S42431D Displaced fracture (avulsion) of lateral epicondyle of right humerus, subsequent encounter for fracture with routine healing: Secondary | ICD-10-CM | POA: Diagnosis not present

## 2020-04-01 DIAGNOSIS — M6281 Muscle weakness (generalized): Secondary | ICD-10-CM | POA: Diagnosis not present

## 2020-04-01 DIAGNOSIS — M25511 Pain in right shoulder: Secondary | ICD-10-CM | POA: Diagnosis not present

## 2020-04-01 DIAGNOSIS — M25611 Stiffness of right shoulder, not elsewhere classified: Secondary | ICD-10-CM | POA: Diagnosis not present

## 2020-04-02 DIAGNOSIS — Z23 Encounter for immunization: Secondary | ICD-10-CM | POA: Diagnosis not present

## 2020-06-10 DIAGNOSIS — Z3040 Encounter for surveillance of contraceptives, unspecified: Secondary | ICD-10-CM | POA: Diagnosis not present

## 2020-06-10 DIAGNOSIS — Z6825 Body mass index (BMI) 25.0-25.9, adult: Secondary | ICD-10-CM | POA: Diagnosis not present

## 2020-06-10 DIAGNOSIS — Z01419 Encounter for gynecological examination (general) (routine) without abnormal findings: Secondary | ICD-10-CM | POA: Diagnosis not present

## 2020-07-01 DIAGNOSIS — J09X1 Influenza due to identified novel influenza A virus with pneumonia: Secondary | ICD-10-CM | POA: Diagnosis not present

## 2020-07-04 DIAGNOSIS — J0140 Acute pansinusitis, unspecified: Secondary | ICD-10-CM | POA: Diagnosis not present

## 2020-09-16 DIAGNOSIS — M009 Pyogenic arthritis, unspecified: Secondary | ICD-10-CM | POA: Diagnosis not present

## 2020-09-16 DIAGNOSIS — R011 Cardiac murmur, unspecified: Secondary | ICD-10-CM | POA: Diagnosis not present

## 2020-09-16 DIAGNOSIS — M79675 Pain in left toe(s): Secondary | ICD-10-CM | POA: Diagnosis not present

## 2020-09-16 DIAGNOSIS — M79676 Pain in unspecified toe(s): Secondary | ICD-10-CM | POA: Diagnosis not present

## 2020-09-16 DIAGNOSIS — Z203 Contact with and (suspected) exposure to rabies: Secondary | ICD-10-CM | POA: Diagnosis not present

## 2020-09-17 ENCOUNTER — Telehealth: Payer: Self-pay

## 2020-09-18 ENCOUNTER — Encounter (HOSPITAL_COMMUNITY): Payer: Self-pay

## 2020-09-18 ENCOUNTER — Emergency Department (HOSPITAL_COMMUNITY)
Admission: EM | Admit: 2020-09-18 | Discharge: 2020-09-18 | Disposition: A | Discharge status: Home / Self Care | Payer: BLUE CROSS/BLUE SHIELD | Attending: Emergency Medicine | Admitting: Emergency Medicine

## 2020-09-18 DIAGNOSIS — Z203 Contact with and (suspected) exposure to rabies: Secondary | ICD-10-CM | POA: Insufficient documentation

## 2020-09-18 DIAGNOSIS — Z0000 Encounter for general adult medical examination without abnormal findings: Secondary | ICD-10-CM | POA: Diagnosis not present

## 2020-09-18 NOTE — ED Triage Notes (Signed)
Pt arrived via POV, c/o exposure to bat 09/01/20

## 2020-09-18 NOTE — ED Notes (Signed)
An After Visit Summary was printed and given to the patient. Discharge instructions given and no further questions at this time.  

## 2020-09-18 NOTE — ED Provider Notes (Signed)
[facility]-EMERGENCY DEPT Provider Note   CSN: [id number] Arrival date & time: 09/18/20  1352     History No chief complaint on file.   Paula Aguirre is a 20 y.o. female.  Patient indicates 8/28 and there was something flying around in her room. Denies any type of open wound, unexplained scratch or abrasion, or any bite mark or other wounds. States was told as possible bat exposure that she needed to come to ED for evaluation. Pt indicates feels fine. No symptoms. No wounds now or prior.   The history is provided by the patient and medical records.      Past Medical History:  Diagnosis Date   History of seizure 12/2010   x 1 - no cause determined   Ovarian cyst    TFCC (triangular fibrocartilage complex) tear 01/2014   left    Patient Active Problem List   Diagnosis Date Noted   Well adolescent visit 04/20/2017    Past Surgical History:  Procedure Laterality Date   CLOSED REDUCTION FOREARM FRACTURE Left    WRIST ARTHROSCOPY WITH DEBRIDEMENT Left 01/30/2014   Procedure: ARTHROSCOPY LEFT WRIST WITH DEBRIDEMENT;  Surgeon: Cindee Salt, MD;  Location: [facility];  Service: Orthopedics;  Laterality: Left;     OB History     Gravida  0   Para      Term      Preterm      AB      Living         SAB      IAB      Ectopic      Multiple      Live Births              Family History  Problem Relation Age of Onset   Diabetes Paternal Grandfather    Heart disease Father        MI    Social History   Tobacco Use   Smoking status: Never   Smokeless tobacco: Never  Substance Use Topics   Alcohol use: No   Drug use: No    Home Medications Prior to Admission medications   Medication Sig Start Date End Date Taking? Authorizing Provider  albuterol (VENTOLIN HFA) 108 (90 Base) MCG/ACT inhaler Inhale into the lungs. 05/27/16   [provider]  fluticasone (FLONASE) 50 MCG/ACT nasal spray Place 1 spray  into both nostrils daily.    [provider]  ibuprofen (ADVIL,MOTRIN) 200 MG tablet Take 600 mg by mouth every 6 (six) hours as needed for moderate pain.     [provider]  norethindrone-ethinyl estradiol (LOESTRIN) 1-20 MG-MCG tablet Take 1qd 03/09/19   Cirigliano, Jearld Lesch, DO    Allergies    Molds & smuts, Cat hair extract, Gramineae pollens, and Pollen extract  Review of Systems   Review of Systems  Constitutional:  Negative for chills and fever.  Musculoskeletal:  Negative for arthralgias and myalgias.  Skin:  Negative for rash and wound.  Neurological:  Negative for weakness and numbness.   Physical Exam Updated Vital Signs BP (!) 132/103 (BP Location: Left Arm)   Pulse 77   Temp 98.1 F (36.7 C) (Oral)   Resp 16   SpO2 98%   Physical Exam Vitals and nursing note reviewed.  Constitutional:      Appearance: Normal appearance. She is well-developed.  HENT:     Head: Atraumatic.     Nose: Nose normal.  Mouth/Throat:     Mouth: Mucous membranes are moist.  Eyes:     General: No scleral icterus.    Conjunctiva/sclera: Conjunctivae normal.  Neck:     Trachea: No tracheal deviation.  Cardiovascular:     Rate and Rhythm: Normal rate and regular rhythm.     Pulses: Normal pulses.  Pulmonary:     Effort: Pulmonary effort is normal. No respiratory distress.  Genitourinary:    Comments: No cva tenderness.  Musculoskeletal:        General: No swelling.     Cervical back: Neck supple. No muscular tenderness.  Skin:    General: Skin is warm and dry.     Findings: No rash.     Comments: No abrasion, bite marks or wounds noted.   Neurological:     Mental Status: She is alert.     Comments: Alert, speech normal. Steady gait.   Psychiatric:        Mood and Affect: Mood normal.    ED Results / Procedures / Treatments   Labs (all labs ordered are listed, but only abnormal results are displayed) Labs Reviewed - No data to  display  EKG None  Radiology No results found.  Procedures Procedures   Medications Ordered in ED Medications - No data to display  ED Course  I have reviewed the triage vital signs and the nursing notes.  Pertinent labs & imaging results that were available during my care of the patient were reviewed by me and considered in my medical decision making (see chart for details).    MDM Rules/Calculators/A&P                          Pt awoke in room with bat flying around 2+ weeks ago. No hx open wounds, abrasions, bite marks, etc. And pt indicates she feels fine.   Reviewed nursing notes and prior charts for additional history.   Pt currently appears stable for d/c.    Final Clinical Impression(s) / ED Diagnoses Final diagnoses:  None    Rx / DC Orders ED Discharge Orders     None        Cathren Laine, MD 09/18/20 1440

## 2020-09-18 NOTE — Discharge Instructions (Signed)
It was our pleasure to provide your ER care today.  From your history, it does not appear you were bitten or had a worrisome exposure.   Return if new symptoms, fevers, trouble breathing, or other emergency concern.

## 2020-09-23 ENCOUNTER — Telehealth: Payer: Self-pay

## 2020-09-23 NOTE — Telephone Encounter (Signed)
Received phone from Paula Aguirre (mother) stated that [name] had been to see her PCP and mentioned bat exposure. PCP recommended for her to contact [facility]. According to mother Paula Aguirre heard something flying around in her bedroom during the night on 09/01/20. It was a bat. Her father came into her room caught the bat in a jar and released it outside. Advised caller recommend for Paula Aguirre to go to [facility] ED to receive Rabies PEP for bat exposure. Call placed to [facility] ED charge-spoke to [facility] advised sending over pt for PEP. Communicated with patient that had called ED and advised what the PEP treatment involved. Advised would follow up with pt and if she had any questions or concerns could call this nurse.

## 2020-09-25 NOTE — Telephone Encounter (Signed)
Corresponded with Medical Director and EPI on call nurse consultant regarding this pt went to [facility] ED on 9/14 . Had recommended due to bat exposure to go to ED to receive PEP. Pt had called to inform this nurse that she did not get the treatment-she did see dr and they did not administer PEP . In consultation with the state and MD we recommended that she return to ED to get the PEP. Educated pt on Rabies .

## 2020-10-16 DIAGNOSIS — N390 Urinary tract infection, site not specified: Secondary | ICD-10-CM | POA: Diagnosis not present

## 2020-11-07 DIAGNOSIS — R011 Cardiac murmur, unspecified: Secondary | ICD-10-CM | POA: Diagnosis not present

## 2020-11-07 DIAGNOSIS — Z6829 Body mass index (BMI) 29.0-29.9, adult: Secondary | ICD-10-CM | POA: Diagnosis not present

## 2020-11-08 DIAGNOSIS — D519 Vitamin B12 deficiency anemia, unspecified: Secondary | ICD-10-CM | POA: Diagnosis not present

## 2020-11-08 DIAGNOSIS — E039 Hypothyroidism, unspecified: Secondary | ICD-10-CM | POA: Diagnosis not present

## 2020-11-08 DIAGNOSIS — R7989 Other specified abnormal findings of blood chemistry: Secondary | ICD-10-CM | POA: Diagnosis not present

## 2020-11-08 DIAGNOSIS — E7800 Pure hypercholesterolemia, unspecified: Secondary | ICD-10-CM | POA: Diagnosis not present

## 2020-11-08 DIAGNOSIS — E559 Vitamin D deficiency, unspecified: Secondary | ICD-10-CM | POA: Diagnosis not present

## 2020-11-08 DIAGNOSIS — E881 Lipodystrophy, not elsewhere classified: Secondary | ICD-10-CM | POA: Diagnosis not present

## 2020-11-08 DIAGNOSIS — N951 Menopausal and female climacteric states: Secondary | ICD-10-CM | POA: Diagnosis not present

## 2020-11-13 DIAGNOSIS — R011 Cardiac murmur, unspecified: Secondary | ICD-10-CM | POA: Diagnosis not present

## 2020-11-13 DIAGNOSIS — R051 Acute cough: Secondary | ICD-10-CM | POA: Diagnosis not present

## 2020-12-05 DIAGNOSIS — R011 Cardiac murmur, unspecified: Secondary | ICD-10-CM | POA: Diagnosis not present

## 2020-12-05 DIAGNOSIS — Z6829 Body mass index (BMI) 29.0-29.9, adult: Secondary | ICD-10-CM | POA: Diagnosis not present

## 2020-12-05 DIAGNOSIS — Z79899 Other long term (current) drug therapy: Secondary | ICD-10-CM | POA: Diagnosis not present

## 2020-12-17 DIAGNOSIS — H6692 Otitis media, unspecified, left ear: Secondary | ICD-10-CM | POA: Diagnosis not present

## 2020-12-17 DIAGNOSIS — H9203 Otalgia, bilateral: Secondary | ICD-10-CM | POA: Diagnosis not present

## 2020-12-17 DIAGNOSIS — U071 COVID-19: Secondary | ICD-10-CM | POA: Diagnosis not present

## 2020-12-17 DIAGNOSIS — Z03818 Encounter for observation for suspected exposure to other biological agents ruled out: Secondary | ICD-10-CM | POA: Diagnosis not present

## 2023-12-26 ENCOUNTER — Encounter: Payer: Self-pay | Admitting: Emergency Medicine

## 2023-12-26 ENCOUNTER — Ambulatory Visit
Admission: EM | Admit: 2023-12-26 | Discharge: 2023-12-26 | Disposition: A | Discharge status: Home / Self Care | Attending: Emergency Medicine | Admitting: Emergency Medicine

## 2023-12-26 DIAGNOSIS — B349 Viral infection, unspecified: Secondary | ICD-10-CM

## 2023-12-26 DIAGNOSIS — Z20828 Contact with and (suspected) exposure to other viral communicable diseases: Secondary | ICD-10-CM

## 2023-12-26 LAB — POC COVID19/FLU A&B COMBO
Covid Antigen, POC: NEGATIVE
Influenza A Antigen, POC: NEGATIVE
Influenza B Antigen, POC: NEGATIVE

## 2023-12-26 MED ORDER — ONDANSETRON HCL 4 MG PO TABS: 4.0000 mg | ORAL_TABLET | Freq: Three times a day (TID) | ORAL | Status: AC | PRN

## 2023-12-26 MED ORDER — ONDANSETRON HCL 4 MG/2ML IJ SOLN
4.0000 mg | Freq: Once | INTRAMUSCULAR | Status: AC
  Administered 2023-12-26: 4 mg via INTRAMUSCULAR

## 2023-12-26 MED ORDER — OSELTAMIVIR PHOSPHATE 75 MG PO CAPS: 75.0000 mg | ORAL_CAPSULE | Freq: Two times a day (BID) | ORAL | 0 refills | Status: AC

## 2023-12-26 MED ORDER — ACETAMINOPHEN 325 MG PO TABS
650.0000 mg | ORAL_TABLET | Freq: Once | ORAL | Status: AC
  Administered 2023-12-26: 650 mg via ORAL

## 2023-12-26 NOTE — ED Provider Notes (Signed)
 " [facility]    CSN: [id number] Arrival date & time: 12/26/23  1257      History   Chief Complaint Chief Complaint  Patient presents with   Headache   Fever   Generalized Body Aches   Chills   Nausea    HPI Jacklyn Anacaren Kohan is a 23 y.o. female.   Patient presents for evaluation of a fever peaking at 100.2, nasal congestion, sinus pressure to the forehead and to the cheeks, intermittent headaches present to the posterior aspect and nausea vomiting and diarrhea beginning this morning.  Vomiting in clinic.  Unable to tolerate foods.  Works at [employer] with flu exposure.  Has not attempted treatment.    Past Medical History:  Diagnosis Date   History of seizure 12/2010   x 1 - no cause determined   Ovarian cyst    TFCC (triangular fibrocartilage complex) tear 01/2014   left    Patient Active Problem List   Diagnosis Date Noted   Well adolescent visit 04/20/2017    Past Surgical History:  Procedure Laterality Date   CLOSED REDUCTION FOREARM FRACTURE Left    WRIST ARTHROSCOPY WITH DEBRIDEMENT Left 01/30/2014   Procedure: ARTHROSCOPY LEFT WRIST WITH DEBRIDEMENT;  Surgeon: Arley Curia, MD;  Location: [facility];  Service: Orthopedics;  Laterality: Left;    OB History     Gravida  0   Para      Term      Preterm      AB      Living         SAB      IAB      Ectopic      Multiple      Live Births               Home Medications    Prior to Admission medications  Medication Sig Start Date End Date Taking? Authorizing Provider  albuterol (VENTOLIN HFA) 108 (90 Base) MCG/ACT inhaler Inhale into the lungs. 05/27/16   [provider]  fluticasone (FLONASE) 50 MCG/ACT nasal spray Place 1 spray into both nostrils daily.    [provider]  ibuprofen  (ADVIL ,MOTRIN ) 200 MG tablet Take 600 mg by mouth every 6 (six) hours as needed for moderate pain.     [provider]  norethindrone -ethinyl estradiol  (LOESTRIN) 1-20 MG-MCG tablet Take 1qd 03/09/19   Cirigliano, Ronal POUR, DO    Family History Family History  Problem Relation Age of Onset   Diabetes Paternal Grandfather    Heart disease Father        MI    Social History Social History[1]   Allergies   Molds & smuts, Cat dander, Gramineae pollens, and Pollen extract   Review of Systems Review of Systems  Constitutional:  Positive for fever. Negative for activity change, appetite change, chills, diaphoresis, fatigue and unexpected weight change.  HENT:  Positive for congestion and sinus pressure. Negative for dental problem, drooling, ear discharge, ear pain, facial swelling, hearing loss, mouth sores, nosebleeds, postnasal drip, rhinorrhea, sinus pain, sneezing, sore throat, tinnitus, trouble swallowing and voice change.   Respiratory: Negative.    Gastrointestinal:  Positive for diarrhea, nausea and vomiting. Negative for abdominal distention, abdominal pain, anal bleeding, blood in stool, constipation and rectal pain.  Neurological:  Positive for headaches. Negative for dizziness, tremors, seizures, syncope, facial asymmetry, speech difficulty, weakness, light-headedness and numbness.     Physical Exam Triage Vital Signs ED Triage Vitals [12/26/23  1511]  Encounter Vitals Group     BP      Girls Systolic BP Percentile      Girls Diastolic BP Percentile      Boys Systolic BP Percentile      Boys Diastolic BP Percentile      Pulse      Resp      Temp      Temp src      SpO2      Weight      Height      Head Circumference      Peak Flow      Pain Score 10     Pain Loc      Pain Education      Exclude from Growth Chart    No data found.  Updated Vital Signs There were no vitals taken for this visit.  Visual Acuity Right Eye Distance:   Left Eye Distance:   Bilateral Distance:    Right Eye Near:   Left Eye Near:    Bilateral Near:     Physical Exam Constitutional:      Appearance: Normal appearance.   HENT:     Head: Normocephalic.     Right Ear: Tympanic membrane, ear canal and external ear normal.     Left Ear: Tympanic membrane, ear canal and external ear normal.     Nose: Congestion present.     Mouth/Throat:     Pharynx: No oropharyngeal exudate or posterior oropharyngeal erythema.  Eyes:     Extraocular Movements: Extraocular movements intact.  Cardiovascular:     Rate and Rhythm: Normal rate and regular rhythm.     Pulses: Normal pulses.     Heart sounds: Normal heart sounds.  Pulmonary:     Effort: Pulmonary effort is normal.     Breath sounds: Normal breath sounds.  Neurological:     Mental Status: She is alert and oriented to person, place, and time. Mental status is at baseline.      UC Treatments / Results  Labs (all labs ordered are listed, but only abnormal results are displayed) Labs Reviewed  POC COVID19/FLU A&B COMBO - Normal    EKG   Radiology No results found.  Procedures Procedures (including critical care time)  Medications Ordered in UC Medications  acetaminophen  (TYLENOL ) tablet 650 mg (has no administration in time range)  ondansetron  (ZOFRAN ) injection 4 mg (4 mg Intramuscular Given 12/26/23 1522)    Initial Impression / Assessment and Plan / UC Course  I have reviewed the triage vital signs and the nursing notes.  Pertinent labs & imaging results that were available during my care of the patient were reviewed by me and considered in my medical decision making (see chart for details).  Viral illness, exposure to flu  Fever 102 with associated tachycardia noted in triage, patient actively vomiting, given Zofran  4 mg IM, on reevaluation symptoms have subsided, given Tylenol  for management of fever, COVID and flu testing negative, due to close exposure with symptoms consistent with current season's presentation empirically treating with Tamiflu  and additionally prescribed Zofran  recommend increase fluid intake with food as tolerable,  recommended over-the-counter medications and nonpharmacological supportive care with follow-up as needed Final Clinical Impressions(s) / UC Diagnoses   Final diagnoses:  Viral illness   Discharge Instructions   None    ED Prescriptions   None    PDMP not reviewed this encounter.     [1]  Social History Tobacco Use  Smoking status: Never   Smokeless tobacco: Never  Substance Use Topics   Alcohol use: No   Drug use: No     Teresa Shelba SAUNDERS, NP 12/26/23 1611  "

## 2023-12-26 NOTE — Discharge Instructions (Signed)
 They are most likely being caused by influenza A, your symptoms are consistent with the current presentation, influenza is a virus and will steadily improve with time  You may take Tamiflu  twice daily for the next 5 days, if you are able to get this medication from the pharmacy then continue supportive treatment, symptoms will improve as the virus works as well after system whether or not you take this medication  You are given injection of Zofran  to help with vomiting, may use oral pill every 8 hours, placed under tongue and allow to dissolve, increase your fluid intake until you are able to tolerate food as normal    You can take Tylenol  and/or Ibuprofen  as needed for fever reduction and pain relief.   For cough: honey 1/2 to 1 teaspoon (you can dilute the honey in water or another fluid).  You can also use guaifenesin and dextromethorphan for cough. You can use a humidifier for chest congestion and cough.  If you don't have a humidifier, you can sit in the bathroom with the hot shower running.      For sore throat: try warm salt water gargles, cepacol lozenges, throat spray, warm tea or water with lemon/honey, popsicles or ice, or OTC cold relief medicine for throat discomfort.   For congestion: take a daily anti-histamine like Zyrtec, Claritin, and a oral decongestant, such as pseudoephedrine.  You can also use Flonase 1-2 sprays in each nostril daily.   It is important to stay hydrated: drink plenty of fluids (water, gatorade/powerade/pedialyte, juices, or teas) to keep your throat moisturized and help further relieve irritation/discomfort.

## 2023-12-26 NOTE — ED Triage Notes (Signed)
 Patient reports headache, fever, body aches, chills  and nausea that started today.  Rates headache 10/10 and body aches 9/10. Patient has not had anything for symptoms.
